# Patient Record
Sex: Female | Born: 1975 | Race: White | Hispanic: No | Marital: Married | Smoking: Never smoker
Health system: Southern US, Community
[De-identification: ages and names within clinical notes are randomized; demographics above are authoritative.]

---

## 2002-06-14 ENCOUNTER — Other Ambulatory Visit: Admission: RE | Admit: 2002-06-14 | Discharge: 2002-06-14 | Payer: Self-pay | Admitting: Obstetrics and Gynecology

## 2003-06-16 ENCOUNTER — Other Ambulatory Visit: Admission: RE | Admit: 2003-06-16 | Discharge: 2003-06-16 | Payer: Self-pay | Admitting: Obstetrics and Gynecology

## 2004-06-18 ENCOUNTER — Other Ambulatory Visit: Admission: RE | Admit: 2004-06-18 | Discharge: 2004-06-18 | Payer: Self-pay | Admitting: Obstetrics and Gynecology

## 2005-06-19 ENCOUNTER — Other Ambulatory Visit: Admission: RE | Admit: 2005-06-19 | Discharge: 2005-06-19 | Payer: Self-pay | Admitting: Obstetrics and Gynecology

## 2007-09-29 ENCOUNTER — Ambulatory Visit (HOSPITAL_COMMUNITY): Admission: RE | Admit: 2007-09-29 | Discharge: 2007-09-29 | Payer: Self-pay | Admitting: Obstetrics and Gynecology

## 2007-09-29 ENCOUNTER — Encounter (INDEPENDENT_AMBULATORY_CARE_PROVIDER_SITE_OTHER): Payer: Self-pay | Admitting: Obstetrics and Gynecology

## 2009-01-09 ENCOUNTER — Inpatient Hospital Stay (HOSPITAL_COMMUNITY): Admission: AD | Admit: 2009-01-09 | Discharge: 2009-01-14 | Payer: Self-pay | Admitting: Obstetrics and Gynecology

## 2009-01-16 ENCOUNTER — Encounter: Admission: RE | Admit: 2009-01-16 | Discharge: 2009-02-15 | Payer: Self-pay | Admitting: Obstetrics and Gynecology

## 2009-02-16 ENCOUNTER — Encounter: Admission: RE | Admit: 2009-02-16 | Discharge: 2009-03-17 | Payer: Self-pay | Admitting: Obstetrics and Gynecology

## 2010-04-18 LAB — URINALYSIS, ROUTINE W REFLEX MICROSCOPIC
Bilirubin Urine: NEGATIVE
Ketones, ur: 15 mg/dL — AB
Protein, ur: NEGATIVE mg/dL
Urine Glucose, Fasting: NEGATIVE mg/dL
pH: 6 (ref 5.0–8.0)

## 2010-04-18 LAB — RPR: RPR Ser Ql: NONREACTIVE

## 2010-04-18 LAB — CBC
HCT: 37.3 % (ref 36.0–46.0)
Hemoglobin: 12.3 g/dL (ref 12.0–15.0)
MCV: 91.6 fL (ref 78.0–100.0)
RBC: 4.07 MIL/uL (ref 3.87–5.11)
WBC: 9 10*3/uL (ref 4.0–10.5)

## 2010-04-18 LAB — SURGICAL PCR SCREEN: MRSA, PCR: NEGATIVE

## 2010-04-18 LAB — URINE MICROSCOPIC-ADD ON

## 2010-04-25 ENCOUNTER — Inpatient Hospital Stay (HOSPITAL_COMMUNITY)
Admission: RE | Admit: 2010-04-25 | Discharge: 2010-04-29 | DRG: 371 | Disposition: A | Discharge status: Home / Self Care | Payer: BC Managed Care – PPO | Attending: Obstetrics and Gynecology | Admitting: Obstetrics and Gynecology

## 2010-04-25 DIAGNOSIS — O34219 Maternal care for unspecified type scar from previous cesarean delivery: Principal | ICD-10-CM | POA: Diagnosis present

## 2010-04-26 LAB — CBC
HCT: 28 % — ABNORMAL LOW (ref 36.0–46.0)
Hemoglobin: 9.1 g/dL — ABNORMAL LOW (ref 12.0–15.0)
MCHC: 32.5 g/dL (ref 30.0–36.0)
RBC: 3.01 MIL/uL — ABNORMAL LOW (ref 3.87–5.11)
WBC: 9.1 10*3/uL (ref 4.0–10.5)

## 2010-04-30 ENCOUNTER — Inpatient Hospital Stay (HOSPITAL_COMMUNITY)
Admission: RE | Admit: 2010-04-30 | Discharge: 2010-04-30 | Disposition: A | Discharge status: Home / Self Care | Payer: BC Managed Care – PPO | Source: Ambulatory Visit

## 2010-04-30 ENCOUNTER — Encounter (HOSPITAL_COMMUNITY)
Admission: RE | Admit: 2010-04-30 | Discharge: 2010-04-30 | Disposition: A | Discharge status: Home / Self Care | Payer: BC Managed Care – PPO | Source: Ambulatory Visit | Attending: Obstetrics and Gynecology | Admitting: Obstetrics and Gynecology

## 2010-04-30 DIAGNOSIS — O923 Agalactia: Secondary | ICD-10-CM | POA: Insufficient documentation

## 2010-05-01 ENCOUNTER — Inpatient Hospital Stay (HOSPITAL_COMMUNITY): Payer: BC Managed Care – PPO

## 2010-05-01 ENCOUNTER — Inpatient Hospital Stay (HOSPITAL_COMMUNITY)
Admission: AD | Admit: 2010-05-01 | Discharge: 2010-05-03 | DRG: 376 | Disposition: A | Discharge status: Home / Self Care | Payer: BC Managed Care – PPO | Source: Ambulatory Visit | Attending: Obstetrics & Gynecology | Admitting: Obstetrics & Gynecology

## 2010-05-01 DIAGNOSIS — O864 Pyrexia of unknown origin following delivery: Secondary | ICD-10-CM

## 2010-05-01 DIAGNOSIS — R300 Dysuria: Secondary | ICD-10-CM | POA: Diagnosis present

## 2010-05-01 DIAGNOSIS — R1031 Right lower quadrant pain: Secondary | ICD-10-CM | POA: Diagnosis present

## 2010-05-01 DIAGNOSIS — O2390 Unspecified genitourinary tract infection in pregnancy, unspecified trimester: Secondary | ICD-10-CM

## 2010-05-01 DIAGNOSIS — N12 Tubulo-interstitial nephritis, not specified as acute or chronic: Secondary | ICD-10-CM | POA: Diagnosis present

## 2010-05-01 LAB — DIFFERENTIAL
Basophils Absolute: 0 10*3/uL (ref 0.0–0.1)
Basophils Relative: 0 % (ref 0–1)
Eosinophils Absolute: 0.1 10*3/uL (ref 0.0–0.7)
Monocytes Relative: 3 % (ref 3–12)
Neutrophils Relative %: 87 % — ABNORMAL HIGH (ref 43–77)

## 2010-05-01 LAB — URINALYSIS, ROUTINE W REFLEX MICROSCOPIC
Leukocytes, UA: NEGATIVE
Protein, ur: NEGATIVE mg/dL
Specific Gravity, Urine: 1.015 (ref 1.005–1.030)
Urine Glucose, Fasting: NEGATIVE mg/dL
pH: 6 (ref 5.0–8.0)

## 2010-05-01 LAB — CBC
MCH: 30.2 pg (ref 26.0–34.0)
MCHC: 32.8 g/dL (ref 30.0–36.0)
Platelets: 292 10*3/uL (ref 150–400)
RBC: 3.54 MIL/uL — ABNORMAL LOW (ref 3.87–5.11)

## 2010-05-01 LAB — URINE MICROSCOPIC-ADD ON

## 2010-05-01 NOTE — Op Note (Signed)
Jillian Salas, Jillian Salas               ACCOUNT NO.:  [id number]  MEDICAL RECORD NO.:  [id number]           PATIENT TYPE:  I  LOCATION:  9143                          FACILITY:  [facility]  PHYSICIAN:  Malva Limes, M.D.    DATE OF BIRTH:  04/27/1975  DATE OF PROCEDURE:  04/25/2010 DATE OF DISCHARGE:                              OPERATIVE REPORT   PREOPERATIVE DIAGNOSES: 1. Intrauterine pregnancy at term. 2. History of prior cesarean section. 3. The patient desires repeat cesarean section.  POSTOPERATIVE DIAGNOSES: 1. Intrauterine pregnancy at term. 2. History of prior cesarean section. 3. The patient desires repeat cesarean section.  PROCEDURE:  Repeat low transverse cesarean section.  SURGEON:  Malva Limes, MD  ASSISTANT:  Randye Lobo, MD  ANESTHESIA:  Spinal.  ANTIBIOTIC:  Ancef 1 g.  DRAINS:  Foley bedside drainage.  ESTIMATED BLOOD LOSS:  900 mL.  COMPLICATIONS:  None.  SPECIMENS:  None.  FINDINGS:  The patient had normal fallopian tubes and ovaries bilaterally.  The uterus appeared to be normal.  The scar was intact.  DESCRIPTION OF PROCEDURE:  The patient was taken to the operating room where she was placed in the dorsal supine position after a spinal anesthetic was administered.  Once adequate level was reached, she was prepped and draped in the usual fashion for this procedure.  A Foley catheter was placed.  A Pfannenstiel incision was made through the previous keloid scar.  On entering the abdominal cavity, the bladder flap was taken down with sharp dissection.  A low-transverse uterine incision was made in the midline with Metzenbaum scissors and extended laterally.  The amniotic sac was entered.  The fluid was clear.  The infant was delivered in the vertex presentation.  On delivery of the head, the oropharynx and nostrils were bulb suctioned.  The remaining infant was then delivered.  The cord was doubly clamped and cut and the infant handed to the  awaiting NICU Team.  The placenta was then manually removed.  Uterus was exteriorized.  Uterine cavity was inspected and wiped with a wet lap.  Uterine incision was closed in a single layer of 0 Monocryl suture in a running locking fashion.  The bladder flap was closed using 2-0 Monocryl suture in a running fashion.  Ovaries and fallopian tubes were then examined.  The uterus was placed back in to the abdominal cavity.  Hemostasis was checked and felt to be adequate. The parietal peritoneum and rectus muscles were approximated in the midline using 2-0 Monocryl in a running fashion.  The fascia was closed using 0 Monocryl suture in a running fashion.  The subcuticular tissue was made hemostatic with a Bovie.  The keloid scar was then removed with a scalpel.  The incision was then closed using stainless steel clips. The patient tolerated the procedure well.  She was taken to the recovery room in stable condition.  Instrument and lap counts were correct x3.          ______________________________ Malva Limes, M.D.     MA/MEDQ  D:  04/25/2010  T:  04/26/2010  Job:  [id number]  Electronically Signed  by Malva Limes M.D. on 05/01/2010 12:27:38 PM

## 2010-05-02 ENCOUNTER — Encounter (HOSPITAL_COMMUNITY): Payer: Self-pay

## 2010-05-02 ENCOUNTER — Other Ambulatory Visit (HOSPITAL_COMMUNITY): Payer: MEDICARE

## 2010-05-02 LAB — URINALYSIS, ROUTINE W REFLEX MICROSCOPIC
Bilirubin Urine: NEGATIVE
Protein, ur: NEGATIVE mg/dL
Specific Gravity, Urine: 1.01 (ref 1.005–1.030)
Urobilinogen, UA: 0.2 mg/dL (ref 0.0–1.0)

## 2010-05-02 LAB — DIFFERENTIAL
Basophils Relative: 0 % (ref 0–1)
Eosinophils Absolute: 0 10*3/uL (ref 0.0–0.7)
Lymphs Abs: 1.5 10*3/uL (ref 0.7–4.0)
Neutrophils Relative %: 86 % — ABNORMAL HIGH (ref 43–77)

## 2010-05-02 LAB — COMPREHENSIVE METABOLIC PANEL
AST: 12 U/L (ref 0–37)
Albumin: 2.2 g/dL — ABNORMAL LOW (ref 3.5–5.2)
Alkaline Phosphatase: 63 U/L (ref 39–117)
Chloride: 105 mEq/L (ref 96–112)
GFR calc Af Amer: >60 mL/min (ref >60)
Potassium: 3.5 mEq/L (ref 3.5–5.1)
Sodium: 139 mEq/L (ref 135–145)
Total Bilirubin: 0.5 mg/dL (ref 0.3–1.2)

## 2010-05-02 LAB — CBC
Platelets: 275 10*3/uL (ref 150–400)
RBC: 3.05 MIL/uL — ABNORMAL LOW (ref 3.87–5.11)
WBC: 12.6 10*3/uL — ABNORMAL HIGH (ref 4.0–10.5)

## 2010-05-02 LAB — URINE CULTURE
Colony Count: NO GROWTH
Culture  Setup Time: 201202080146

## 2010-05-02 LAB — URINE MICROSCOPIC-ADD ON

## 2010-05-02 MED ORDER — IOHEXOL 300 MG/ML  SOLN
100.0000 mL | Freq: Once | INTRAMUSCULAR | Status: AC | PRN
  Administered 2010-05-02: 100 mL via INTRAVENOUS

## 2010-05-03 LAB — COMPREHENSIVE METABOLIC PANEL
Albumin: 2 g/dL — ABNORMAL LOW (ref 3.5–5.2)
Alkaline Phosphatase: 65 U/L (ref 39–117)
BUN: 5 mg/dL — ABNORMAL LOW (ref 6–23)
Calcium: 8 mg/dL — ABNORMAL LOW (ref 8.4–10.5)
Creatinine, Ser: 0.91 mg/dL (ref 0.4–1.2)
Potassium: 4.1 mEq/L (ref 3.5–5.1)
Total Protein: 5 g/dL — ABNORMAL LOW (ref 6.0–8.3)

## 2010-05-03 LAB — CBC
MCHC: 32.1 g/dL (ref 30.0–36.0)
MCV: 92.9 fL (ref 78.0–100.0)
Platelets: 293 10*3/uL (ref 150–400)
RDW: 16 % — ABNORMAL HIGH (ref 11.5–15.5)
WBC: 10.7 10*3/uL — ABNORMAL HIGH (ref 4.0–10.5)

## 2010-05-04 NOTE — Discharge Summary (Signed)
  NAMEJANEVA, Jillian Salas               ACCOUNT NO.:  [id number]  MEDICAL RECORD NO.:  [id number]           PATIENT TYPE:  I  LOCATION:  9143                          FACILITY:  [facility]  PHYSICIAN:  Brylei Pedley H. Tenny Craw, MD     DATE OF BIRTH:  03-Aug-1975  DATE OF ADMISSION:  04/25/2010 DATE OF DISCHARGE:  04/29/2010                              DISCHARGE SUMMARY   ATTENDING PHYSICIAN:  Latravis Grine H. Tenny Craw, MD  HOSPITAL PROCEDURES:  Repeat low transverse cesarean section.  HOSPITAL COURSE:  Jillian Salas is a 35 year old G3, P1-0-1-1, now P2-0-1-1, who presented at 39 weeks for a scheduled repeat cesarean section.  She was delivered by Dr. Malva Limes on April 25, 2010, by a repeat low transverse cesarean section for a viable female infant weighing 3680 g or 8 pounds 2 ounces with Apgar scores of 9 and 9.  Postoperatively, she did well.  On postoperative day #3, the patient and baby were sleeping in her hospital bed when the baby inadvertently fell off the patient's chest and fell to the floor.  The baby was evaluated by the pediatricians and felt to be well.  He was taken to the newborn nursery for close surveillance given maternal exhaustion, the decision was made to keep the patient in the hospital one more day to allow for rest.  On postoperative day #4, the patient had gotten adequate rest the night before.  Baby was doing well and both were deemed ready for discharge home.  She was discharged with instructions for signs or symptoms to be aware of her need to return to the hospital.  She does claim Percocet for pain control and will continue to take ibuprofen.  She will follow up in the office in 4 weeks.  Staples were removed prior to discharge and Steri-Strips were placed.  DISCHARGE LABORATORY DATA:  White blood cell count 9.1, hemoglobin 9.1, hematocrit 28, platelets 137.     Freddrick March. Tenny Craw, MD     KHR/MEDQ  D:  04/29/2010  T:  04/30/2010  Job:  [id number]  Electronically Signed by  Waynard Reeds MD on 05/04/2010 12:43:18 PM

## 2010-05-08 ENCOUNTER — Other Ambulatory Visit: Payer: Self-pay | Admitting: Obstetrics and Gynecology

## 2010-06-28 LAB — CBC
HCT: 25.3 % — ABNORMAL LOW (ref 36.0–46.0)
HCT: 35 % — ABNORMAL LOW (ref 36.0–46.0)
Hemoglobin: 11.8 g/dL — ABNORMAL LOW (ref 12.0–15.0)
MCHC: 33.8 g/dL (ref 30.0–36.0)
MCHC: 33.9 g/dL (ref 30.0–36.0)
MCV: 96.5 fL (ref 78.0–100.0)
MCV: 97.4 fL (ref 78.0–100.0)
Platelets: 133 10*3/uL — ABNORMAL LOW (ref 150–400)
Platelets: 151 10*3/uL (ref 150–400)
RBC: 3.63 MIL/uL — ABNORMAL LOW (ref 3.87–5.11)
RDW: 17.9 % — ABNORMAL HIGH (ref 11.5–15.5)
WBC: 13.1 10*3/uL — ABNORMAL HIGH (ref 4.0–10.5)

## 2010-06-28 LAB — URINALYSIS, DIPSTICK ONLY
Bilirubin Urine: NEGATIVE
Glucose, UA: NEGATIVE mg/dL
Ketones, ur: NEGATIVE mg/dL
Protein, ur: NEGATIVE mg/dL
Urobilinogen, UA: 0.2 mg/dL (ref 0.0–1.0)

## 2010-06-28 LAB — COMPREHENSIVE METABOLIC PANEL
Alkaline Phosphatase: 126 U/L — ABNORMAL HIGH (ref 39–117)
BUN: 9 mg/dL (ref 6–23)
CO2: 23 mEq/L (ref 19–32)
Chloride: 103 mEq/L (ref 96–112)
Creatinine, Ser: 0.85 mg/dL (ref 0.4–1.2)
GFR calc non Af Amer: >60 mL/min (ref >60)
Total Bilirubin: 0.4 mg/dL (ref 0.3–1.2)

## 2010-07-12 NOTE — Discharge Summary (Signed)
  [name], Jillian Salas               ACCOUNT NO.:  [id number]  MEDICAL RECORD NO.:  [id number]           PATIENT TYPE:  I  LOCATION:  9319                          [facility]ACILITY:  [facility]  PHYSICIAN:  Malva Limes, M.D.    DATE OF BIRTH:  05-Sep-1975  DATE OF ADMISSION:  05/01/2010 DATE OF DISCHARGE:  05/03/2010                              DISCHARGE SUMMARY   DISCHARGE DIAGNOSES: 1. Status post cesarean section. 2. Abdominal pain. 3. Possible pyelonephritis.  PRINCIPAL PROCEDURES: 1. CT scan. 2. Antibiotic therapy.  HISTORY OF PRESENT ILLNESS:  [name] is a 35 year old female status post C-section on April 26, 2010, who had an uncomplicated postoperative course.  The patient presented to the emergency room and was seen by Dr. Annamaria Helling with an onset of right-sided abdominal and back pain.  The patient did have a temperature of 100.3.  Initial white count was 9.9.  HOSPITAL COURSE:  The patient was admitted.  She underwent a CT scan, which revealed no evidence of any ascites or pelvic abscess.  There was a question whether she had pyelonephritis; however, it was indicated it may be bilateral which would be highly unlikely.  The patient was treated with Rocephin for a few days at that time, then the pain drastically improved.  She was discharged to home on Keflex to take for 5 more days.  She was instructed to follow up in the office in 1 week. She was told to call with any increasing pain or fever.          ______________________________ Malva Limes, M.D.     MA/MEDQ  D:  07/11/2010  T:  07/11/2010  Job:  [id number]  Electronically Signed by Malva Limes M.D. on 07/12/2010 12:28:53 PM

## 2010-08-07 NOTE — Op Note (Signed)
NAME:  Jillian Salas, Jillian Salas               ACCOUNT NO.:  [id number]   MEDICAL RECORD NO.:  [id number]          PATIENT TYPE:  AMB   LOCATION:  SDC                           FACILITY:  [facility]   PHYSICIAN:  Randye Lobo, M.D.   DATE OF BIRTH:  [birth year]   DATE OF PROCEDURE:  09/29/2007  DATE OF DISCHARGE:                               OPERATIVE REPORT   PREOPERATIVE DIAGNOSIS:  Missed abortion.   POSTOPERATIVE DIAGNOSIS:  Missed abortion.   PROCEDURE:  Dilation and evacuation.   SURGEON:  Randye Lobo, MD   ANESTHESIA:  MAC, paracervical block with 1% lidocaine.   INTRAVENOUS FLUIDS:  700 mL.   URINE OUTPUT:  50 mL by I and O catheterization.   ESTIMATED BLOOD LOSS:  Minimal.   COMPLICATIONS:  None.   INDICATIONS FOR THE PROCEDURE:  The patient is a 35 year old gravida 1,  para 65 Caucasian female with last menstrual period on July 15, 2007,  who was diagnosed with a missed abortion by ultrasound at 6 plus 1-week  gestation.  The patient did have a follow up ultrasound approximately 5  days later, which confirmed a missed abortion.  At that time, the sac  was noted to be low in the endometrial canal.  The patient was presented  with options for care and she chose to proceed with intravaginal Cytotec  therapy.  The patient did not respond to the Cytotec and a plan is now  made at this time to proceed with a dilation and evacuation after risks,  benefits, and alternatives are reviewed.   FINDINGS:  Exam under anesthesia revealed a 6-week size anteverted  mobile uterus.  No adnexal masses were appreciated.  A small amount of  products of conception were obtained.   SPECIMEN:  Products of conception were sent to pathology.   PROCEDURE:  The patient was reidentified in the preoperative hold area.  She received doxycycline 100 mg p.o. x1 for antibiotic prophylaxis.   In the operating room, the patient was placed in a dorsal lithotomy  position and MAC anesthesia was then induced.   The vagina and perineum  were sterilely prepped and draped, and the patient was catheterized of  urine with a red rubber catheter.   An examination under anesthesia was performed.  A speculum was placed  inside the vagina and a single-tooth tenaculum was placed on the  anterior cervical lip.  A paracervical block was performed in standard  fashion with a total of 10 mL of 1% lidocaine.  The uterus was sounded  to 7 cm.  The cervix was then sequentially dilated to a #23 Pratt  dilator.  A #7 suction tip curette was then introduced through the  cervix to the level of the uterine fundus and withdrawn slightly.  Proper suction was applied and the suction tip curette was then turned  in a clockwise fashion as it was removed from the uterine cavity.  This  was repeated an additional 2 times.  Gentle sharp curettage was then  performed in all 4 quadrants.  No remaining products of conception were  obtained.  The suction tip curette and proper suction were applied one  final time to remove any remaining blood from within the uterine cavity.   Hemostasis was excellent.  All of the vaginal instruments were removed.  The patient was cleansed with Betadine.  She was awakened and escorted  to the recovery room in stable and awake condition.  There were no  complications to the procedure.  All needle, instrument, and sponge  counts were correct.      Randye Lobo, M.D.  Electronically Signed     BES/MEDQ  D:  09/29/2007  T:  09/30/2007  Job:  [id number]

## 2010-12-20 LAB — URINALYSIS, ROUTINE W REFLEX MICROSCOPIC
Bilirubin Urine: NEGATIVE
Hgb urine dipstick: NEGATIVE
Nitrite: NEGATIVE
Specific Gravity, Urine: >1.03 — ABNORMAL HIGH
Urobilinogen, UA: 0.2
pH: 5.5

## 2010-12-20 LAB — CBC
HCT: 39.7
Hemoglobin: 13.3
Platelets: 238
RDW: 13.9
WBC: 6.5

## 2010-12-20 LAB — ABO/RH: ABO/RH(D): O POS

## 2013-06-10 ENCOUNTER — Other Ambulatory Visit: Payer: Self-pay | Admitting: Obstetrics and Gynecology

## 2014-06-22 ENCOUNTER — Other Ambulatory Visit: Payer: Self-pay | Admitting: Obstetrics and Gynecology

## 2014-06-23 LAB — CYTOLOGY - PAP

## 2015-07-17 ENCOUNTER — Other Ambulatory Visit: Payer: Self-pay | Admitting: Obstetrics and Gynecology

## 2015-07-18 LAB — CYTOLOGY - PAP

## 2016-06-03 DIAGNOSIS — Z23 Encounter for immunization: Secondary | ICD-10-CM | POA: Diagnosis not present

## 2016-07-22 ENCOUNTER — Other Ambulatory Visit: Payer: Self-pay | Admitting: Obstetrics and Gynecology

## 2016-07-22 DIAGNOSIS — Z124 Encounter for screening for malignant neoplasm of cervix: Secondary | ICD-10-CM | POA: Diagnosis not present

## 2016-07-22 DIAGNOSIS — Z01419 Encounter for gynecological examination (general) (routine) without abnormal findings: Secondary | ICD-10-CM | POA: Diagnosis not present

## 2016-07-22 DIAGNOSIS — Z6833 Body mass index (BMI) 33.0-33.9, adult: Secondary | ICD-10-CM | POA: Diagnosis not present

## 2016-07-22 DIAGNOSIS — Z1231 Encounter for screening mammogram for malignant neoplasm of breast: Secondary | ICD-10-CM | POA: Diagnosis not present

## 2016-07-23 LAB — CYTOLOGY - PAP

## 2017-02-23 DIAGNOSIS — R05 Cough: Secondary | ICD-10-CM | POA: Diagnosis not present

## 2017-02-23 DIAGNOSIS — K219 Gastro-esophageal reflux disease without esophagitis: Secondary | ICD-10-CM | POA: Diagnosis not present

## 2017-07-30 DIAGNOSIS — Z1231 Encounter for screening mammogram for malignant neoplasm of breast: Secondary | ICD-10-CM | POA: Diagnosis not present

## 2017-07-30 DIAGNOSIS — Z124 Encounter for screening for malignant neoplasm of cervix: Secondary | ICD-10-CM | POA: Diagnosis not present

## 2017-07-30 DIAGNOSIS — Z01419 Encounter for gynecological examination (general) (routine) without abnormal findings: Secondary | ICD-10-CM | POA: Diagnosis not present

## 2017-07-30 DIAGNOSIS — Z6831 Body mass index (BMI) 31.0-31.9, adult: Secondary | ICD-10-CM | POA: Diagnosis not present

## 2017-12-04 DIAGNOSIS — R0981 Nasal congestion: Secondary | ICD-10-CM | POA: Diagnosis not present

## 2018-01-27 DIAGNOSIS — Z13220 Encounter for screening for lipoid disorders: Secondary | ICD-10-CM | POA: Diagnosis not present

## 2018-01-27 DIAGNOSIS — Z23 Encounter for immunization: Secondary | ICD-10-CM | POA: Diagnosis not present

## 2018-01-27 DIAGNOSIS — M79604 Pain in right leg: Secondary | ICD-10-CM | POA: Diagnosis not present

## 2018-01-27 DIAGNOSIS — Z0000 Encounter for general adult medical examination without abnormal findings: Secondary | ICD-10-CM | POA: Diagnosis not present

## 2018-08-20 DIAGNOSIS — Z6831 Body mass index (BMI) 31.0-31.9, adult: Secondary | ICD-10-CM | POA: Diagnosis not present

## 2018-08-20 DIAGNOSIS — Z1231 Encounter for screening mammogram for malignant neoplasm of breast: Secondary | ICD-10-CM | POA: Diagnosis not present

## 2018-08-20 DIAGNOSIS — Z01419 Encounter for gynecological examination (general) (routine) without abnormal findings: Secondary | ICD-10-CM | POA: Diagnosis not present

## 2018-08-20 DIAGNOSIS — Z124 Encounter for screening for malignant neoplasm of cervix: Secondary | ICD-10-CM | POA: Diagnosis not present

## 2019-02-09 DIAGNOSIS — Z1159 Encounter for screening for other viral diseases: Secondary | ICD-10-CM | POA: Diagnosis not present

## 2019-02-10 DIAGNOSIS — Z1159 Encounter for screening for other viral diseases: Secondary | ICD-10-CM | POA: Diagnosis not present

## 2019-02-15 DIAGNOSIS — Z0000 Encounter for general adult medical examination without abnormal findings: Secondary | ICD-10-CM | POA: Diagnosis not present

## 2019-03-04 DIAGNOSIS — Z6834 Body mass index (BMI) 34.0-34.9, adult: Secondary | ICD-10-CM | POA: Diagnosis not present

## 2019-03-04 DIAGNOSIS — Z13220 Encounter for screening for lipoid disorders: Secondary | ICD-10-CM | POA: Diagnosis not present

## 2019-03-04 DIAGNOSIS — Z0000 Encounter for general adult medical examination without abnormal findings: Secondary | ICD-10-CM | POA: Diagnosis not present

## 2019-09-27 DIAGNOSIS — M25512 Pain in left shoulder: Secondary | ICD-10-CM | POA: Diagnosis not present

## 2019-10-04 DIAGNOSIS — M67912 Unspecified disorder of synovium and tendon, left shoulder: Secondary | ICD-10-CM | POA: Diagnosis not present

## 2019-10-10 ENCOUNTER — Encounter (HOSPITAL_BASED_OUTPATIENT_CLINIC_OR_DEPARTMENT_OTHER): Payer: Self-pay | Admitting: Emergency Medicine

## 2019-10-10 ENCOUNTER — Other Ambulatory Visit: Payer: Self-pay

## 2019-10-10 ENCOUNTER — Emergency Department (HOSPITAL_BASED_OUTPATIENT_CLINIC_OR_DEPARTMENT_OTHER): Payer: BC Managed Care – PPO

## 2019-10-10 ENCOUNTER — Emergency Department (HOSPITAL_BASED_OUTPATIENT_CLINIC_OR_DEPARTMENT_OTHER)
Admission: EM | Admit: 2019-10-10 | Discharge: 2019-10-10 | Disposition: A | Discharge status: Home / Self Care | Payer: BC Managed Care – PPO | Attending: Emergency Medicine | Admitting: Emergency Medicine

## 2019-10-10 DIAGNOSIS — Y998 Other external cause status: Secondary | ICD-10-CM | POA: Insufficient documentation

## 2019-10-10 DIAGNOSIS — Y92094 Garage of other non-institutional residence as the place of occurrence of the external cause: Secondary | ICD-10-CM | POA: Insufficient documentation

## 2019-10-10 DIAGNOSIS — M7731 Calcaneal spur, right foot: Secondary | ICD-10-CM | POA: Diagnosis not present

## 2019-10-10 DIAGNOSIS — S99911A Unspecified injury of right ankle, initial encounter: Secondary | ICD-10-CM | POA: Insufficient documentation

## 2019-10-10 DIAGNOSIS — Y9301 Activity, walking, marching and hiking: Secondary | ICD-10-CM | POA: Insufficient documentation

## 2019-10-10 DIAGNOSIS — X509XXA Other and unspecified overexertion or strenuous movements or postures, initial encounter: Secondary | ICD-10-CM | POA: Diagnosis not present

## 2019-10-10 DIAGNOSIS — S99921A Unspecified injury of right foot, initial encounter: Secondary | ICD-10-CM

## 2019-10-10 NOTE — ED Notes (Signed)
X-rays being performed

## 2019-10-10 NOTE — ED Provider Notes (Signed)
[facility] EMERGENCY DEPARTMENT Provider Note   CSN: [id number] Arrival date & time: 10/10/19  1837     History Chief Complaint  Patient presents with  . Fall    Jillian Salas is a 44 y.o. female with no significant past medical history who presents to the ED after mechanical fall that occurred just prior to arrival.  Patient states she was walking to the garage, tripped over a step causing her right ankle to twist inward.  Patient notes falling directly to the ground.  Denies head injury loss of consciousness.  She is not currently on any blood thinners.  Patient admits to right ankle pain worse on the lateral aspect.  Denies numbness and tingling of right lower extremity.  No treatment prior to arrival.  No previous right ankle injury.  She rates her pain a 5/10, worse with movement and ambulation.  History obtained from patient and past medical records. No interpreter used during encounter.      History reviewed. No pertinent past medical history.  There are no problems to display for this patient.   History reviewed. No pertinent surgical history.   OB History   No obstetric history on file.     No family history on file.  Social History   Tobacco Use  . Smoking status: Never Smoker  . Smokeless tobacco: Never Used  Vaping Use  . Vaping Use: Never used  Substance Use Topics  . Alcohol use: Yes  . Drug use: Never    Home Medications Prior to Admission medications   Not on File    Allergies    Patient has no known allergies.  Review of Systems   Review of Systems  Musculoskeletal: Positive for arthralgias, gait problem and joint swelling.  Neurological: Negative for numbness.  All other systems reviewed and are negative.   Physical Exam Updated Vital Signs BP (!) 155/91 (BP Location: Right Arm)   Pulse 76   Temp 98.8 F (37.1 C) (Oral)   Resp 18   Ht 5\' 4"  (1.626 m)   Wt 93 kg   LMP 09/28/2019   SpO2 99%   BMI 35.19 kg/m    Physical Exam Vitals and nursing note reviewed.  Constitutional:      General: She is not in acute distress.    Appearance: She is not ill-appearing.  HENT:     Head: Normocephalic.  Eyes:     Pupils: Pupils are equal, round, and reactive to light.  Cardiovascular:     Rate and Rhythm: Normal rate and regular rhythm.     Pulses: Normal pulses.     Heart sounds: Normal heart sounds. No murmur heard.  No friction rub. No gallop.   Pulmonary:     Effort: Pulmonary effort is normal.     Breath sounds: Normal breath sounds.  Abdominal:     General: Abdomen is flat. There is no distension.     Palpations: Abdomen is soft.     Tenderness: There is no abdominal tenderness. There is no guarding or rebound.  Musculoskeletal:     Cervical back: Neck supple.     Comments: Tenderness over the lateral aspect of right ankle with overlying edema.  Limited range of motion of right ankle due to pain.  Tenderness over fourth and fifth metatarsal.  Full range of motion of all toes.  Pedal pulses intact.  Sensation intact.  Full range of motion of right knee.  Skin:    General: Skin is warm  and dry.  Neurological:     General: No focal deficit present.     Mental Status: She is alert.  Psychiatric:        Mood and Affect: Mood normal.        Behavior: Behavior normal.     ED Results / Procedures / Treatments   Labs (all labs ordered are listed, but only abnormal results are displayed) Labs Reviewed - No data to display  EKG None  Radiology DG Ankle Complete Right  Result Date: 10/10/2019 CLINICAL DATA:  Pain. EXAM: RIGHT ANKLE - COMPLETE 3+ VIEW; RIGHT FOOT COMPLETE - 3+ VIEW COMPARISON:  None. FINDINGS: There is no evidence of fracture, dislocation, or joint effusion. There is no evidence of arthropathy or other focal bone abnormality. Soft tissues are unremarkable. There is a small plantar calcaneal spur. IMPRESSION: Negative. Electronically Signed   By: Katherine Mantle M.D.   On:  10/10/2019 19:15   DG Foot Complete Right  Result Date: 10/10/2019 CLINICAL DATA:  Pain. EXAM: RIGHT ANKLE - COMPLETE 3+ VIEW; RIGHT FOOT COMPLETE - 3+ VIEW COMPARISON:  None. FINDINGS: There is no evidence of fracture, dislocation, or joint effusion. There is no evidence of arthropathy or other focal bone abnormality. Soft tissues are unremarkable. There is a small plantar calcaneal spur. IMPRESSION: Negative. Electronically Signed   By: Katherine Mantle M.D.   On: 10/10/2019 19:15    Procedures Procedures (including critical care time)  Medications Ordered in ED Medications - No data to display  ED Course  I have reviewed the triage vital signs and the nursing notes.  Pertinent labs & imaging results that were available during my care of the patient were reviewed by me and considered in my medical decision making (see chart for details).    MDM Rules/Calculators/A&P                         44 year old female presents to the ED after mechanical fall causing her to twist her right ankle. Upon arrival, stable vitals. Patient in no acute distress and non-ill appearing. Physical exam significant for tenderness over the lateral aspect of right ankle with overlying edema.  Limited range of motion of right ankle due to pain.  Tenderness over fourth and fifth metatarsal.  Full range of motion of all toes.  Pedal pulses intact.  Sensation intact.  Full range of motion of right knee.  Soft compartments.  Will obtain x-rays to rule out bony fractures.  Patient deferred pain treatment at this time.  X-rays personally reviewed which are negative for any bony fractures.  Patient placed in ankle brace with crutches.  RICE discussed with patient.  Instructed patient to follow-up with PCP if symptoms do not improved in the next week.  Advised patient to take over-the-counter ibuprofen or Tylenol as needed for pain. Strict ED precautions discussed with patient. Patient states understanding and agrees to  plan. Patient discharged home in no acute distress and stable vitals. Final Clinical Impression(s) / ED Diagnoses Final diagnoses:  Injury of right ankle, initial encounter  Injury of right foot, initial encounter    Rx / DC Orders ED Discharge Orders    None       Jesusita Oka 10/10/19 1931    Gwyneth Sprout, MD 10/11/19 2140

## 2019-10-10 NOTE — Discharge Instructions (Addendum)
As discussed, your x-rays were negative for any broken bones.  I suspect you sprained your ankle.  You may use the splint and crutches as needed for comfort.  Continue to ice and elevate your leg.  You may take over-the-counter ibuprofen and Tylenol as needed for pain.  Please follow-up with PCP symptoms not improved within the next week.  Return to the ER for new or worsening symptoms.

## 2019-10-10 NOTE — ED Notes (Signed)
Presents with rt ankle pain and swelling. Pt states she fell in garage after missing a step, states she did not hit her head, basically missed the last step per her description of the incident, pt currently on stretcher, husband at bedside. Rt foot elevated and ice pack applied, ED PA at bedside

## 2019-10-10 NOTE — ED Triage Notes (Signed)
Pt reports that she missed a step going down into her garage. Pt c/o pain and swelling to right foot and ankle.

## 2019-10-11 DIAGNOSIS — Z01419 Encounter for gynecological examination (general) (routine) without abnormal findings: Secondary | ICD-10-CM | POA: Diagnosis not present

## 2019-10-11 DIAGNOSIS — Z124 Encounter for screening for malignant neoplasm of cervix: Secondary | ICD-10-CM | POA: Diagnosis not present

## 2019-10-11 DIAGNOSIS — Z1231 Encounter for screening mammogram for malignant neoplasm of breast: Secondary | ICD-10-CM | POA: Diagnosis not present

## 2019-10-11 DIAGNOSIS — Z6835 Body mass index (BMI) 35.0-35.9, adult: Secondary | ICD-10-CM | POA: Diagnosis not present

## 2019-11-18 DIAGNOSIS — Z20822 Contact with and (suspected) exposure to covid-19: Secondary | ICD-10-CM | POA: Diagnosis not present

## 2020-02-21 DIAGNOSIS — Z0000 Encounter for general adult medical examination without abnormal findings: Secondary | ICD-10-CM | POA: Diagnosis not present

## 2020-02-21 DIAGNOSIS — Z713 Dietary counseling and surveillance: Secondary | ICD-10-CM | POA: Diagnosis not present

## 2020-02-21 DIAGNOSIS — E669 Obesity, unspecified: Secondary | ICD-10-CM | POA: Diagnosis not present

## 2020-10-19 DIAGNOSIS — Z6827 Body mass index (BMI) 27.0-27.9, adult: Secondary | ICD-10-CM | POA: Diagnosis not present

## 2020-10-19 DIAGNOSIS — Z01419 Encounter for gynecological examination (general) (routine) without abnormal findings: Secondary | ICD-10-CM | POA: Diagnosis not present

## 2020-10-19 DIAGNOSIS — Z124 Encounter for screening for malignant neoplasm of cervix: Secondary | ICD-10-CM | POA: Diagnosis not present

## 2020-10-23 ENCOUNTER — Other Ambulatory Visit: Payer: Self-pay | Admitting: Obstetrics and Gynecology

## 2020-10-23 DIAGNOSIS — Z1231 Encounter for screening mammogram for malignant neoplasm of breast: Secondary | ICD-10-CM

## 2020-10-25 ENCOUNTER — Other Ambulatory Visit: Payer: Self-pay | Admitting: Obstetrics and Gynecology

## 2020-10-25 DIAGNOSIS — N63 Unspecified lump in unspecified breast: Secondary | ICD-10-CM

## 2020-11-29 ENCOUNTER — Ambulatory Visit
Admission: RE | Admit: 2020-11-29 | Discharge: 2020-11-29 | Disposition: A | Discharge status: Home / Self Care | Payer: BC Managed Care – PPO | Source: Ambulatory Visit | Attending: Obstetrics and Gynecology | Admitting: Obstetrics and Gynecology

## 2020-11-29 ENCOUNTER — Other Ambulatory Visit: Payer: Self-pay

## 2020-11-29 DIAGNOSIS — R922 Inconclusive mammogram: Secondary | ICD-10-CM | POA: Diagnosis not present

## 2020-11-29 DIAGNOSIS — N6489 Other specified disorders of breast: Secondary | ICD-10-CM | POA: Diagnosis not present

## 2020-11-29 DIAGNOSIS — N63 Unspecified lump in unspecified breast: Secondary | ICD-10-CM

## 2020-11-30 DIAGNOSIS — R87810 Cervical high risk human papillomavirus (HPV) DNA test positive: Secondary | ICD-10-CM | POA: Diagnosis not present

## 2020-11-30 DIAGNOSIS — R87610 Atypical squamous cells of undetermined significance on cytologic smear of cervix (ASC-US): Secondary | ICD-10-CM | POA: Diagnosis not present

## 2020-11-30 DIAGNOSIS — Z3202 Encounter for pregnancy test, result negative: Secondary | ICD-10-CM | POA: Diagnosis not present

## 2020-11-30 DIAGNOSIS — Z6827 Body mass index (BMI) 27.0-27.9, adult: Secondary | ICD-10-CM | POA: Diagnosis not present

## 2020-11-30 DIAGNOSIS — N870 Mild cervical dysplasia: Secondary | ICD-10-CM | POA: Diagnosis not present

## 2021-02-21 DIAGNOSIS — Z0000 Encounter for general adult medical examination without abnormal findings: Secondary | ICD-10-CM | POA: Diagnosis not present

## 2021-02-21 DIAGNOSIS — E7800 Pure hypercholesterolemia, unspecified: Secondary | ICD-10-CM | POA: Diagnosis not present

## 2021-10-04 DIAGNOSIS — Z1211 Encounter for screening for malignant neoplasm of colon: Secondary | ICD-10-CM | POA: Diagnosis not present

## 2021-12-03 DIAGNOSIS — Z124 Encounter for screening for malignant neoplasm of cervix: Secondary | ICD-10-CM | POA: Diagnosis not present

## 2021-12-03 DIAGNOSIS — Z01419 Encounter for gynecological examination (general) (routine) without abnormal findings: Secondary | ICD-10-CM | POA: Diagnosis not present

## 2021-12-03 DIAGNOSIS — Z1231 Encounter for screening mammogram for malignant neoplasm of breast: Secondary | ICD-10-CM | POA: Diagnosis not present

## 2021-12-05 IMAGING — US US BREAST*L* LIMITED INC AXILLA
1 series · 4 of 4 positions shown · non-contrast
Comparison: Previous exams.

CLINICAL DATA: 45-year-old female with 3 palpable areas of concern
in the right breast and 1 palpable area of concern in the left
breast following weight loss.



[Series 1: us breast*left* limited inc axilla · 0.06mm/px · 4 of 4 slices shown]
[im 1/4]
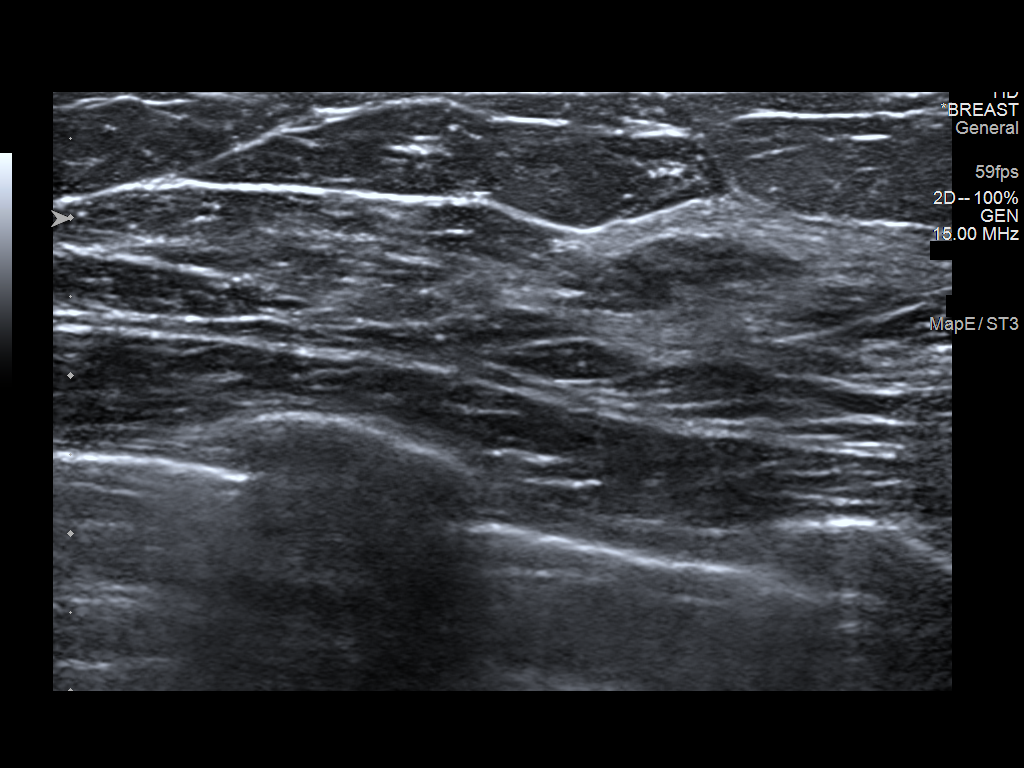
[im 2/4]
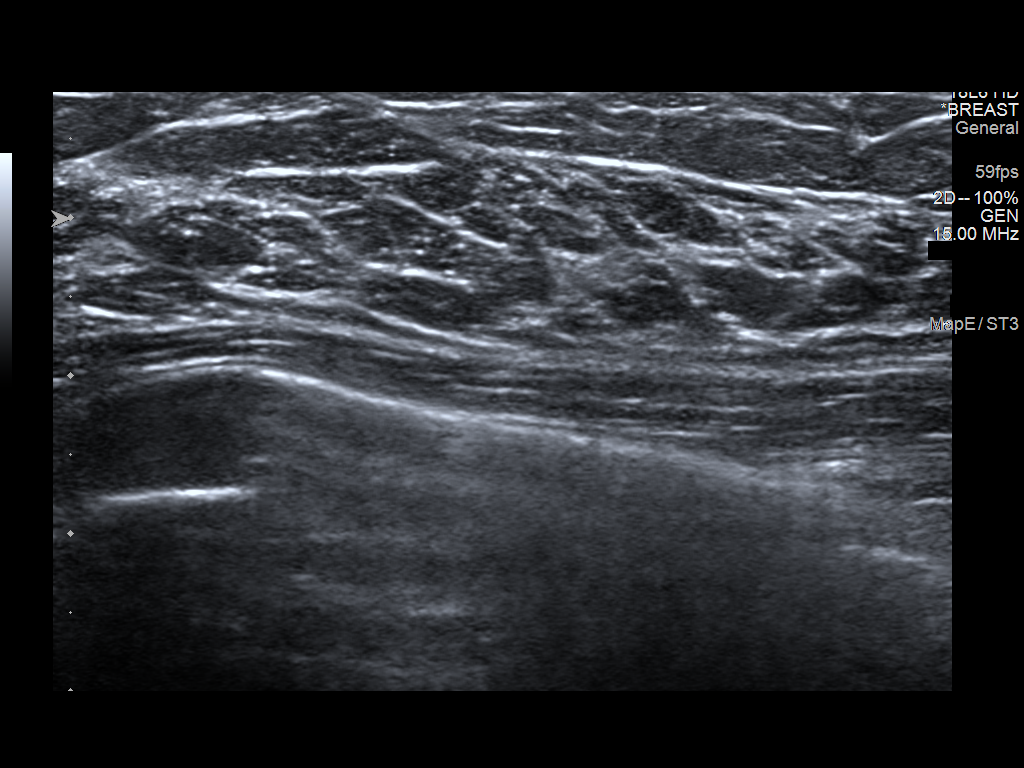
[im 3/4]
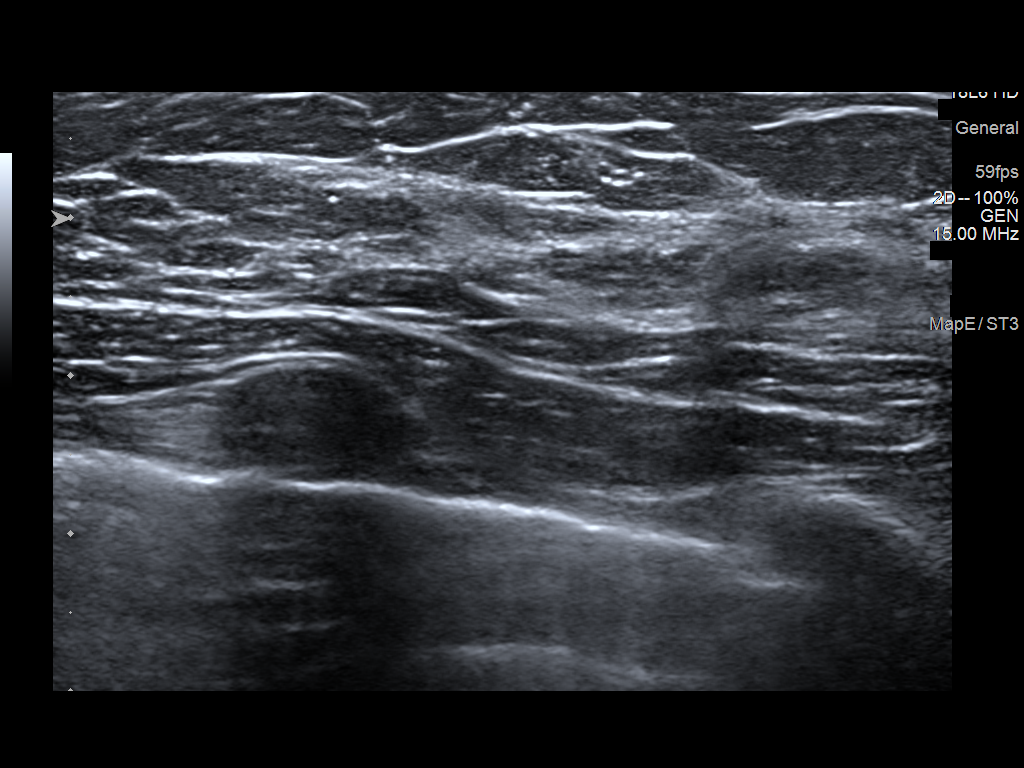
[im 4/4]
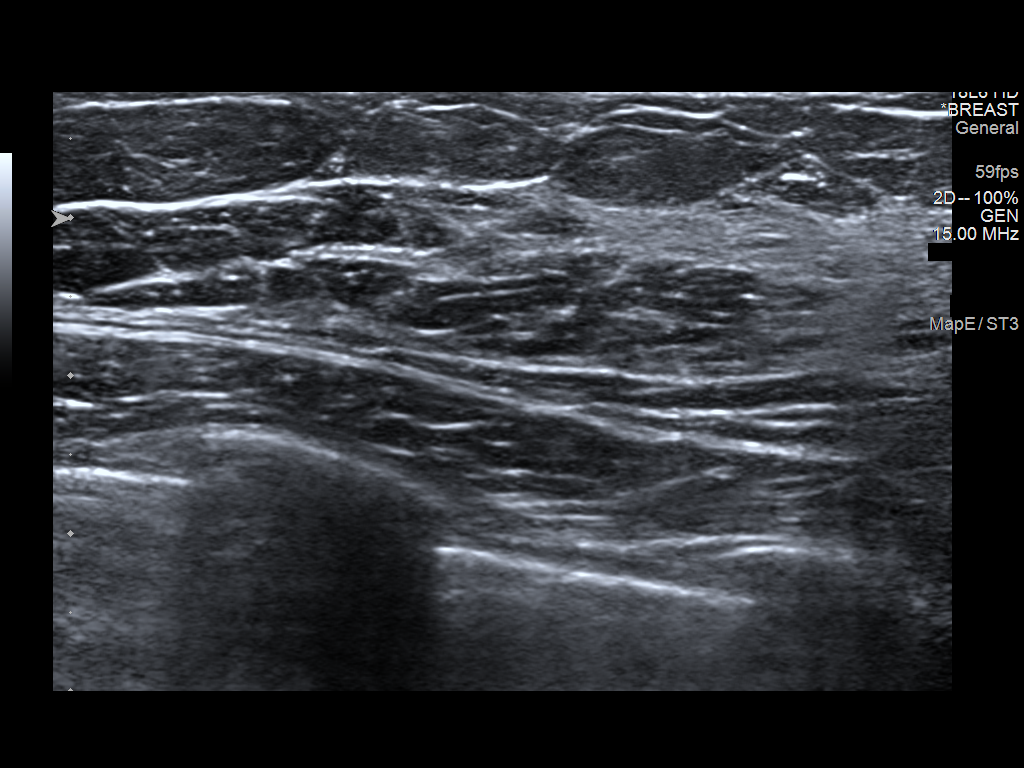

[4 of 4 positions shown; findings below may reference images not displayed]

ACR Breast Density Category c: The breast tissue is heterogeneously
dense, which may obscure small masses.
FINDINGS: No suspicious masses or calcifications are seen in either breast.
Initially questioned calcifications in the slightly inner left
breast demonstrate layering on the spot compression magnification ML
view with findings compatible with benign milk of calcium. Spot
compression tomograms were performed over the 3 palpable areas of
concern in the right breast and the additional palpable area of
concern in the left breast with only heterogeneous fibroglandular
tissue identified.

Targeted ultrasound of the bilateral breasts in the regions of
palpable concern was performed. No suspicious masses or
abnormalities seen at the sites of palpable concern, only
heterogeneous fibroglandular tissue identified.
IMPRESSION: 1. No mammographic or sonographic abnormalities at the sites of
palpable concern in each breast.

2.  No mammographic evidence of malignancy in either breast.

RECOMMENDATION:
1. Recommend further management of the palpable areas of concern in
each breast be based on clinical assessment.

2.  Screening mammogram in one year.(Code:0H-6-KG7)

I have discussed the findings and recommendations with the patient.
If applicable, a reminder letter will be sent to the patient
regarding the next appointment.

BI-RADS CATEGORY  1: Negative.

## 2021-12-05 IMAGING — MG DIGITAL DIAGNOSTIC BILAT W/ TOMO W/ CAD
8 of 17 series · 8 of 40 positions shown · non-contrast
Comparison: Previous exams.

CLINICAL DATA: 45-year-old female with 3 palpable areas of concern
in the right breast and 1 palpable area of concern in the left
breast following weight loss.



[L ML (1 of 2)]
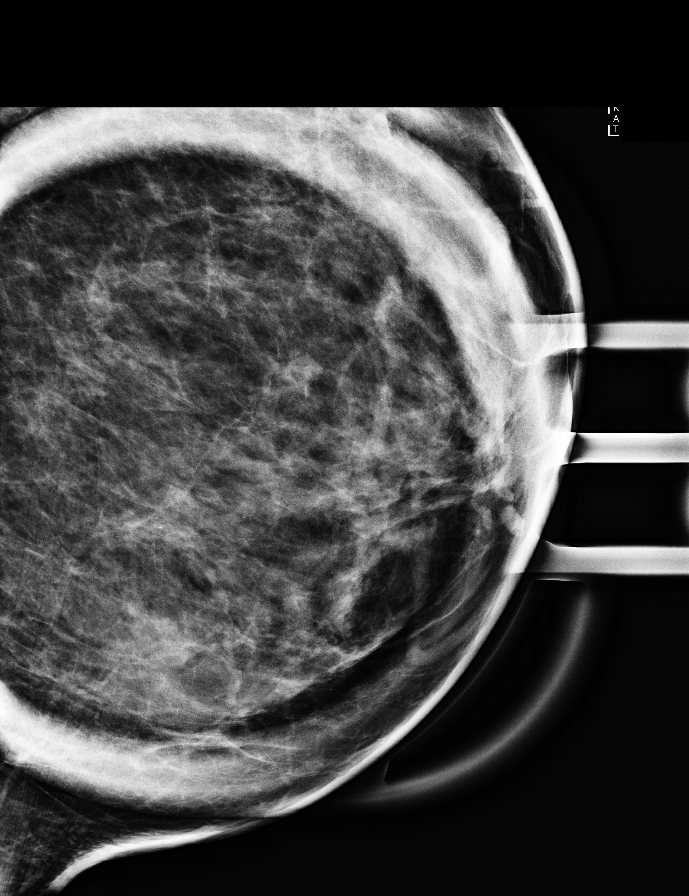

[L ML (2 of 2)]
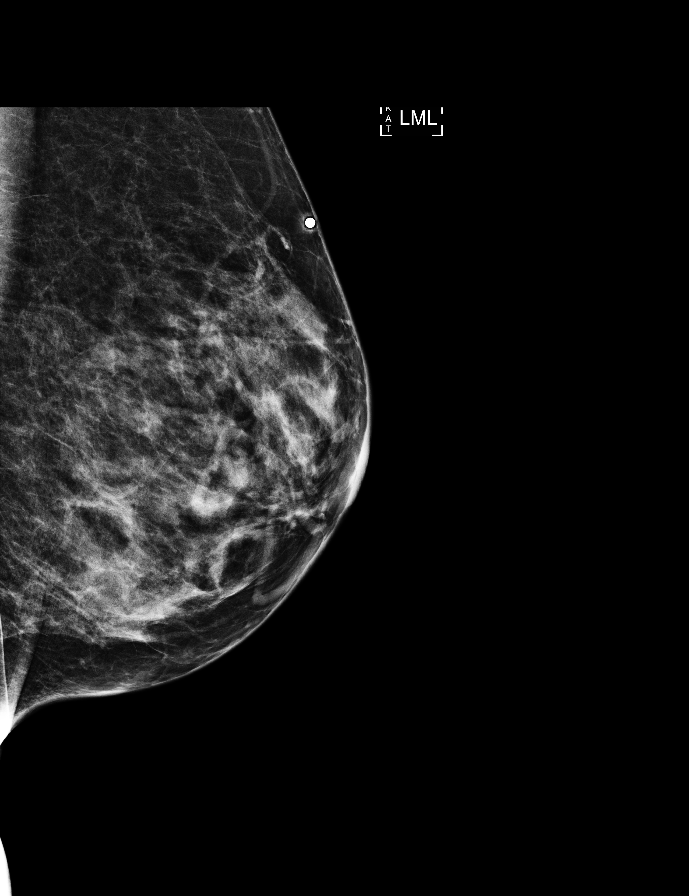

[R TAN synth-2D (1 of 2)]
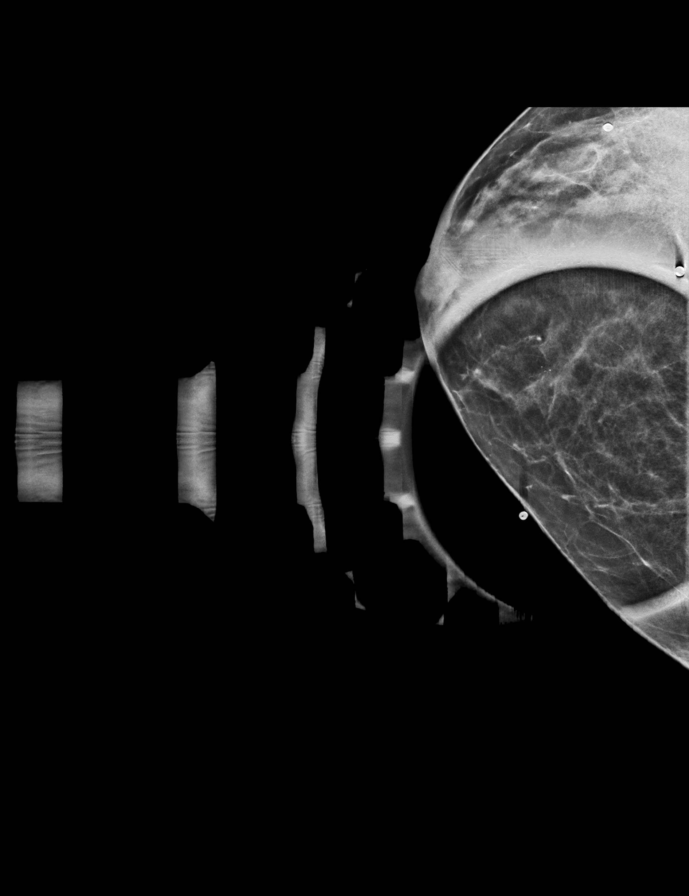

[L CC synth-2D]
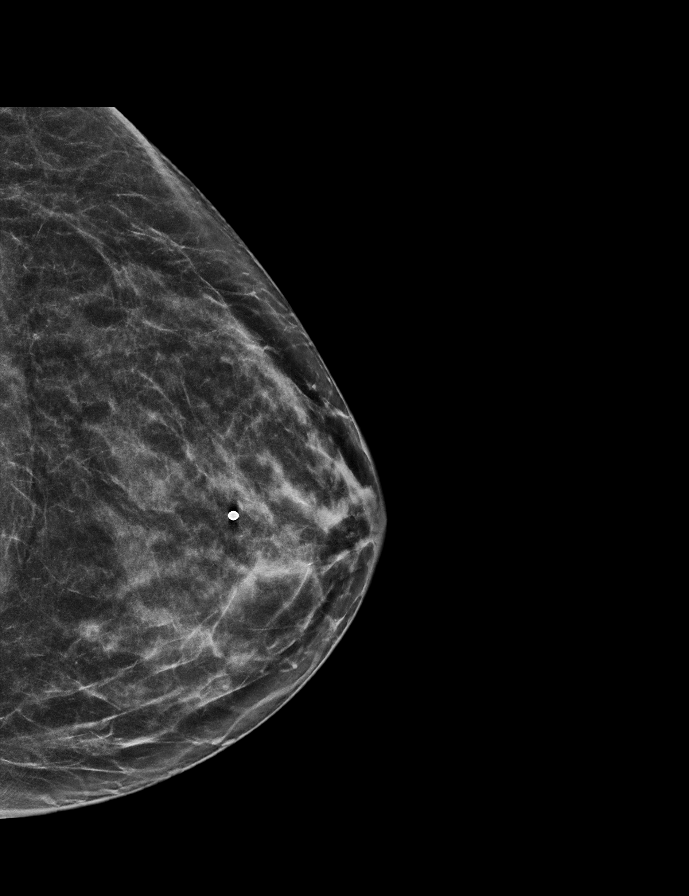

[R CC synth-2D]
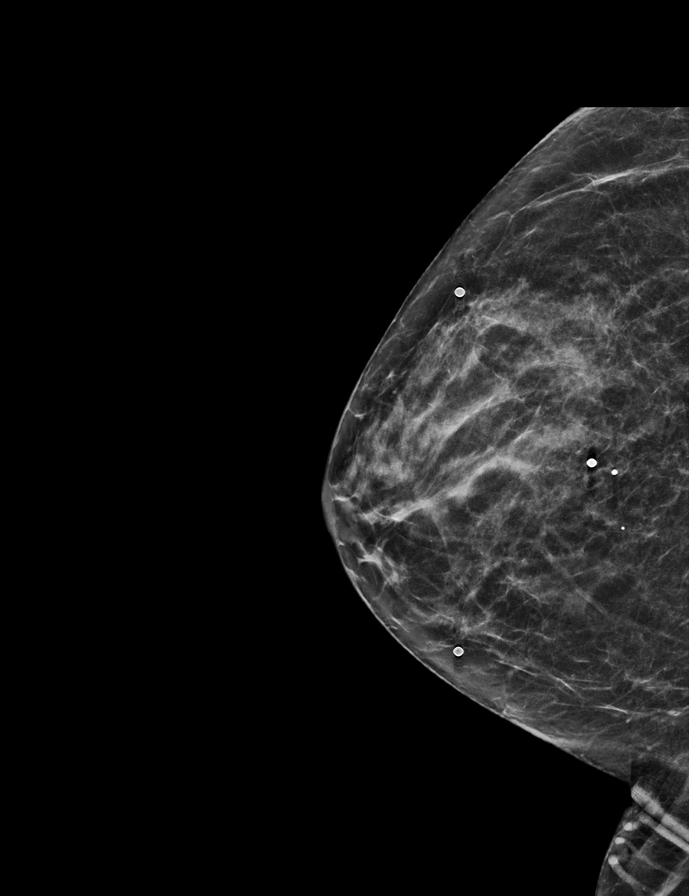

[L MLO synth-2D]
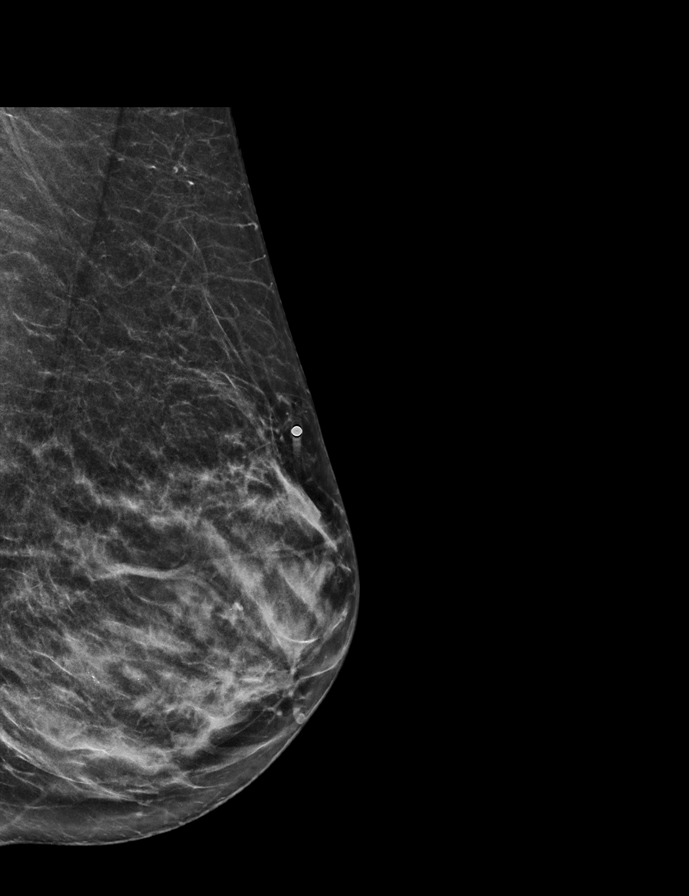

[R MLO synth-2D]
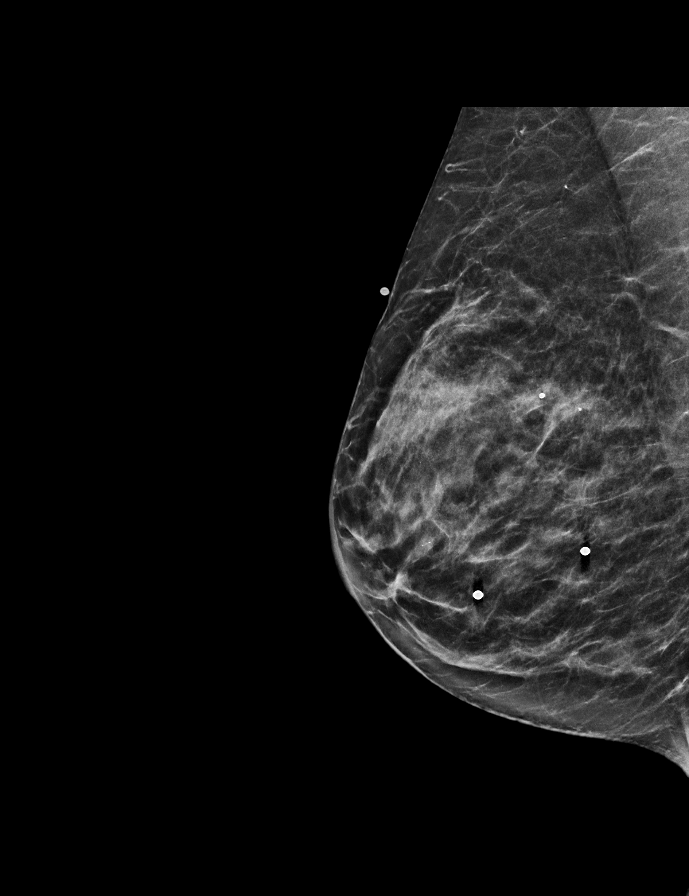

[R TAN synth-2D (2 of 2)]
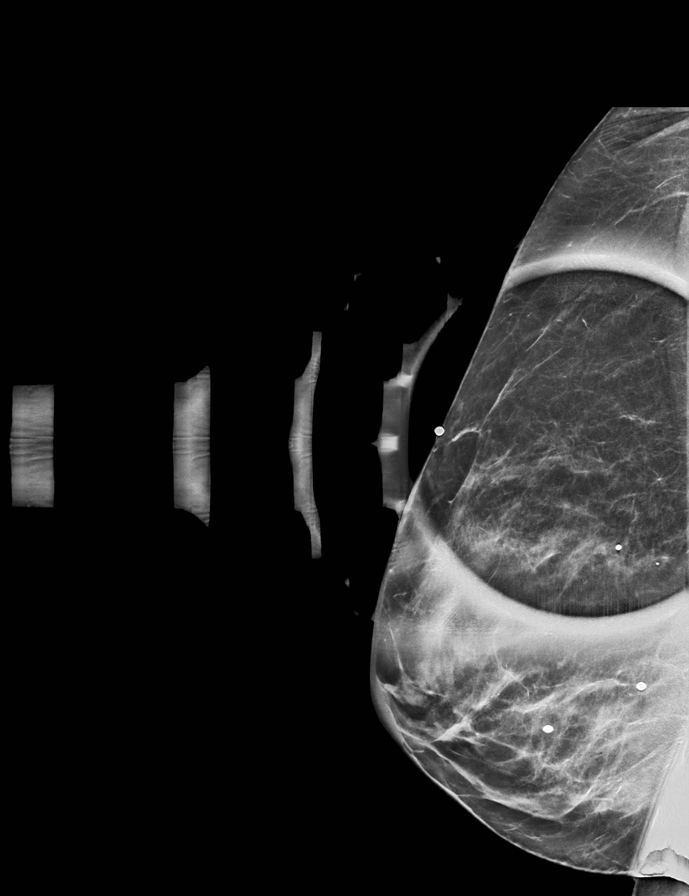

[8 of 40 positions shown; findings below may reference images not displayed]

ACR Breast Density Category c: The breast tissue is heterogeneously
dense, which may obscure small masses.
FINDINGS: No suspicious masses or calcifications are seen in either breast.
Initially questioned calcifications in the slightly inner left
breast demonstrate layering on the spot compression magnification ML
view with findings compatible with benign milk of calcium. Spot
compression tomograms were performed over the 3 palpable areas of
concern in the right breast and the additional palpable area of
concern in the left breast with only heterogeneous fibroglandular
tissue identified.

Targeted ultrasound of the bilateral breasts in the regions of
palpable concern was performed. No suspicious masses or
abnormalities seen at the sites of palpable concern, only
heterogeneous fibroglandular tissue identified.
IMPRESSION: 1. No mammographic or sonographic abnormalities at the sites of
palpable concern in each breast.

2.  No mammographic evidence of malignancy in either breast.

RECOMMENDATION:
1. Recommend further management of the palpable areas of concern in
each breast be based on clinical assessment.

2.  Screening mammogram in one year.(Code:0H-6-KG7)

I have discussed the findings and recommendations with the patient.
If applicable, a reminder letter will be sent to the patient
regarding the next appointment.

BI-RADS CATEGORY  1: Negative.

## 2022-01-09 DIAGNOSIS — R87610 Atypical squamous cells of undetermined significance on cytologic smear of cervix (ASC-US): Secondary | ICD-10-CM | POA: Diagnosis not present

## 2022-01-09 DIAGNOSIS — Z3202 Encounter for pregnancy test, result negative: Secondary | ICD-10-CM | POA: Diagnosis not present

## 2022-01-09 DIAGNOSIS — R87810 Cervical high risk human papillomavirus (HPV) DNA test positive: Secondary | ICD-10-CM | POA: Diagnosis not present

## 2022-01-09 DIAGNOSIS — Z6832 Body mass index (BMI) 32.0-32.9, adult: Secondary | ICD-10-CM | POA: Diagnosis not present

## 2022-02-08 DIAGNOSIS — Z713 Dietary counseling and surveillance: Secondary | ICD-10-CM | POA: Diagnosis not present

## 2022-02-28 DIAGNOSIS — Z0000 Encounter for general adult medical examination without abnormal findings: Secondary | ICD-10-CM | POA: Diagnosis not present

## 2022-02-28 DIAGNOSIS — E7800 Pure hypercholesterolemia, unspecified: Secondary | ICD-10-CM | POA: Diagnosis not present

## 2023-01-23 DIAGNOSIS — Z01419 Encounter for gynecological examination (general) (routine) without abnormal findings: Secondary | ICD-10-CM | POA: Diagnosis not present

## 2023-01-23 DIAGNOSIS — Z1231 Encounter for screening mammogram for malignant neoplasm of breast: Secondary | ICD-10-CM | POA: Diagnosis not present

## 2023-01-23 DIAGNOSIS — Z124 Encounter for screening for malignant neoplasm of cervix: Secondary | ICD-10-CM | POA: Diagnosis not present

## 2023-03-06 DIAGNOSIS — Z13220 Encounter for screening for lipoid disorders: Secondary | ICD-10-CM | POA: Diagnosis not present

## 2023-03-06 DIAGNOSIS — Z0000 Encounter for general adult medical examination without abnormal findings: Secondary | ICD-10-CM | POA: Diagnosis not present

## 2024-02-16 DIAGNOSIS — Z1231 Encounter for screening mammogram for malignant neoplasm of breast: Secondary | ICD-10-CM | POA: Diagnosis not present

## 2024-02-16 DIAGNOSIS — Z01419 Encounter for gynecological examination (general) (routine) without abnormal findings: Secondary | ICD-10-CM | POA: Diagnosis not present

## 2024-03-10 DIAGNOSIS — Z6835 Body mass index (BMI) 35.0-35.9, adult: Secondary | ICD-10-CM | POA: Diagnosis not present

## 2024-03-10 DIAGNOSIS — Z0000 Encounter for general adult medical examination without abnormal findings: Secondary | ICD-10-CM | POA: Diagnosis not present

## 2024-03-10 DIAGNOSIS — E7800 Pure hypercholesterolemia, unspecified: Secondary | ICD-10-CM | POA: Diagnosis not present
